# Patient Record
Sex: Male | Born: 1955 | Race: Black or African American | Hispanic: No | Marital: Married | Smoking: Never smoker
Health system: Southern US, Community
[De-identification: ages and names within clinical notes are randomized; demographics above are authoritative.]

---

## 1999-08-06 ENCOUNTER — Ambulatory Visit (HOSPITAL_COMMUNITY): Admission: RE | Admit: 1999-08-06 | Discharge: 1999-08-06 | Payer: Self-pay

## 2008-01-12 ENCOUNTER — Emergency Department (HOSPITAL_COMMUNITY): Admission: EM | Admit: 2008-01-12 | Discharge: 2008-01-12 | Payer: Self-pay | Admitting: Family Medicine

## 2010-04-25 LAB — CBC
HCT: 45.2 % (ref 39.0–52.0)
Hemoglobin: 14.7 g/dL (ref 13.0–17.0)
MCHC: 32.5 g/dL (ref 30.0–36.0)
MCV: 83.9 fL (ref 78.0–100.0)
Platelets: 218 10*3/uL (ref 150–400)
RBC: 5.39 MIL/uL (ref 4.22–5.81)
RDW: 13.9 % (ref 11.5–15.5)
WBC: 6.3 10*3/uL (ref 4.0–10.5)

## 2010-04-25 LAB — LIPASE, BLOOD: Lipase: 27 U/L (ref 11–59)

## 2010-04-25 LAB — COMPREHENSIVE METABOLIC PANEL
ALT: 37 U/L (ref 0–53)
AST: 32 U/L (ref 0–37)
Albumin: 4.1 g/dL (ref 3.5–5.2)
Alkaline Phosphatase: 49 U/L (ref 39–117)
BUN: 11 mg/dL (ref 6–23)
CO2: 26 mEq/L (ref 19–32)
Calcium: 9.1 mg/dL (ref 8.4–10.5)
Chloride: 101 mEq/L (ref 96–112)
Creatinine, Ser: 1.13 mg/dL (ref 0.4–1.5)
GFR calc Af Amer: >60 mL/min (ref >60)
GFR calc non Af Amer: >60 mL/min (ref >60)
Glucose, Bld: 117 mg/dL — ABNORMAL HIGH (ref 70–99)
Potassium: 3.7 mEq/L (ref 3.5–5.1)
Sodium: 136 mEq/L (ref 135–145)
Total Bilirubin: 0.5 mg/dL (ref 0.3–1.2)
Total Protein: 7.6 g/dL (ref 6.0–8.3)

## 2010-04-25 LAB — DIFFERENTIAL
Basophils Absolute: 0 10*3/uL (ref 0.0–0.1)
Basophils Relative: 0 % (ref 0–1)
Eosinophils Absolute: 0.2 10*3/uL (ref 0.0–0.7)
Eosinophils Relative: 3 % (ref 0–5)
Lymphocytes Relative: 40 % (ref 12–46)
Lymphs Abs: 2.5 10*3/uL (ref 0.7–4.0)
Monocytes Absolute: 0.5 10*3/uL (ref 0.1–1.0)
Monocytes Relative: 8 % (ref 3–12)
Neutro Abs: 3.1 10*3/uL (ref 1.7–7.7)
Neutrophils Relative %: 49 % (ref 43–77)

## 2013-07-15 ENCOUNTER — Other Ambulatory Visit: Payer: Self-pay | Admitting: Internal Medicine

## 2013-07-15 ENCOUNTER — Ambulatory Visit
Admission: RE | Admit: 2013-07-15 | Discharge: 2013-07-15 | Disposition: A | Discharge status: Home / Self Care | Payer: BC Managed Care – PPO | Source: Ambulatory Visit | Attending: Internal Medicine | Admitting: Internal Medicine

## 2013-07-15 DIAGNOSIS — R05 Cough: Secondary | ICD-10-CM

## 2013-07-15 DIAGNOSIS — R059 Cough, unspecified: Secondary | ICD-10-CM

## 2015-02-26 IMAGING — CR DG CHEST 2V
2 series · 2 of 2 positions shown · non-contrast
Comparison: None.

CLINICAL DATA: Cough.

EXAM:
CHEST  2 VIEW

[w chest pa]
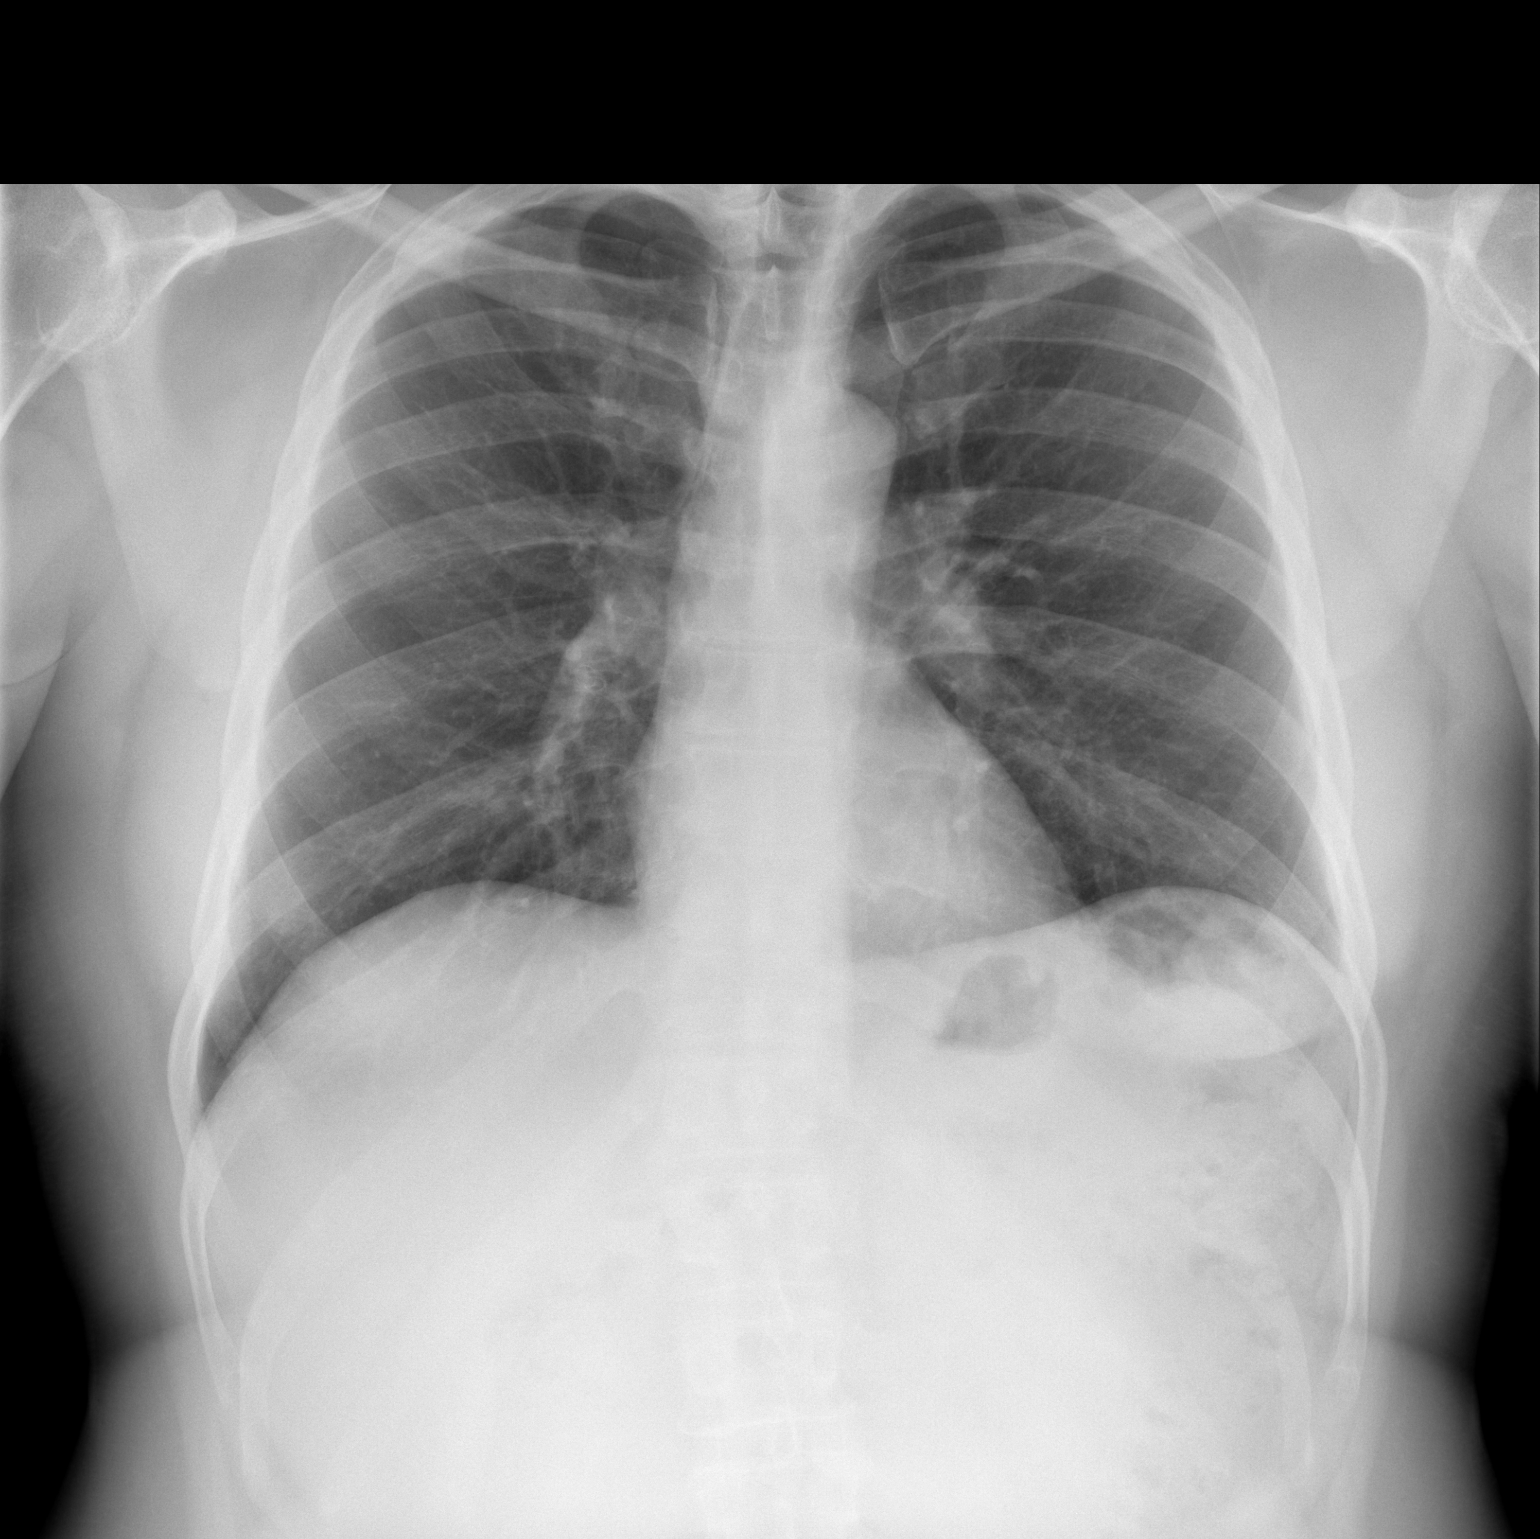

[w chest lat]
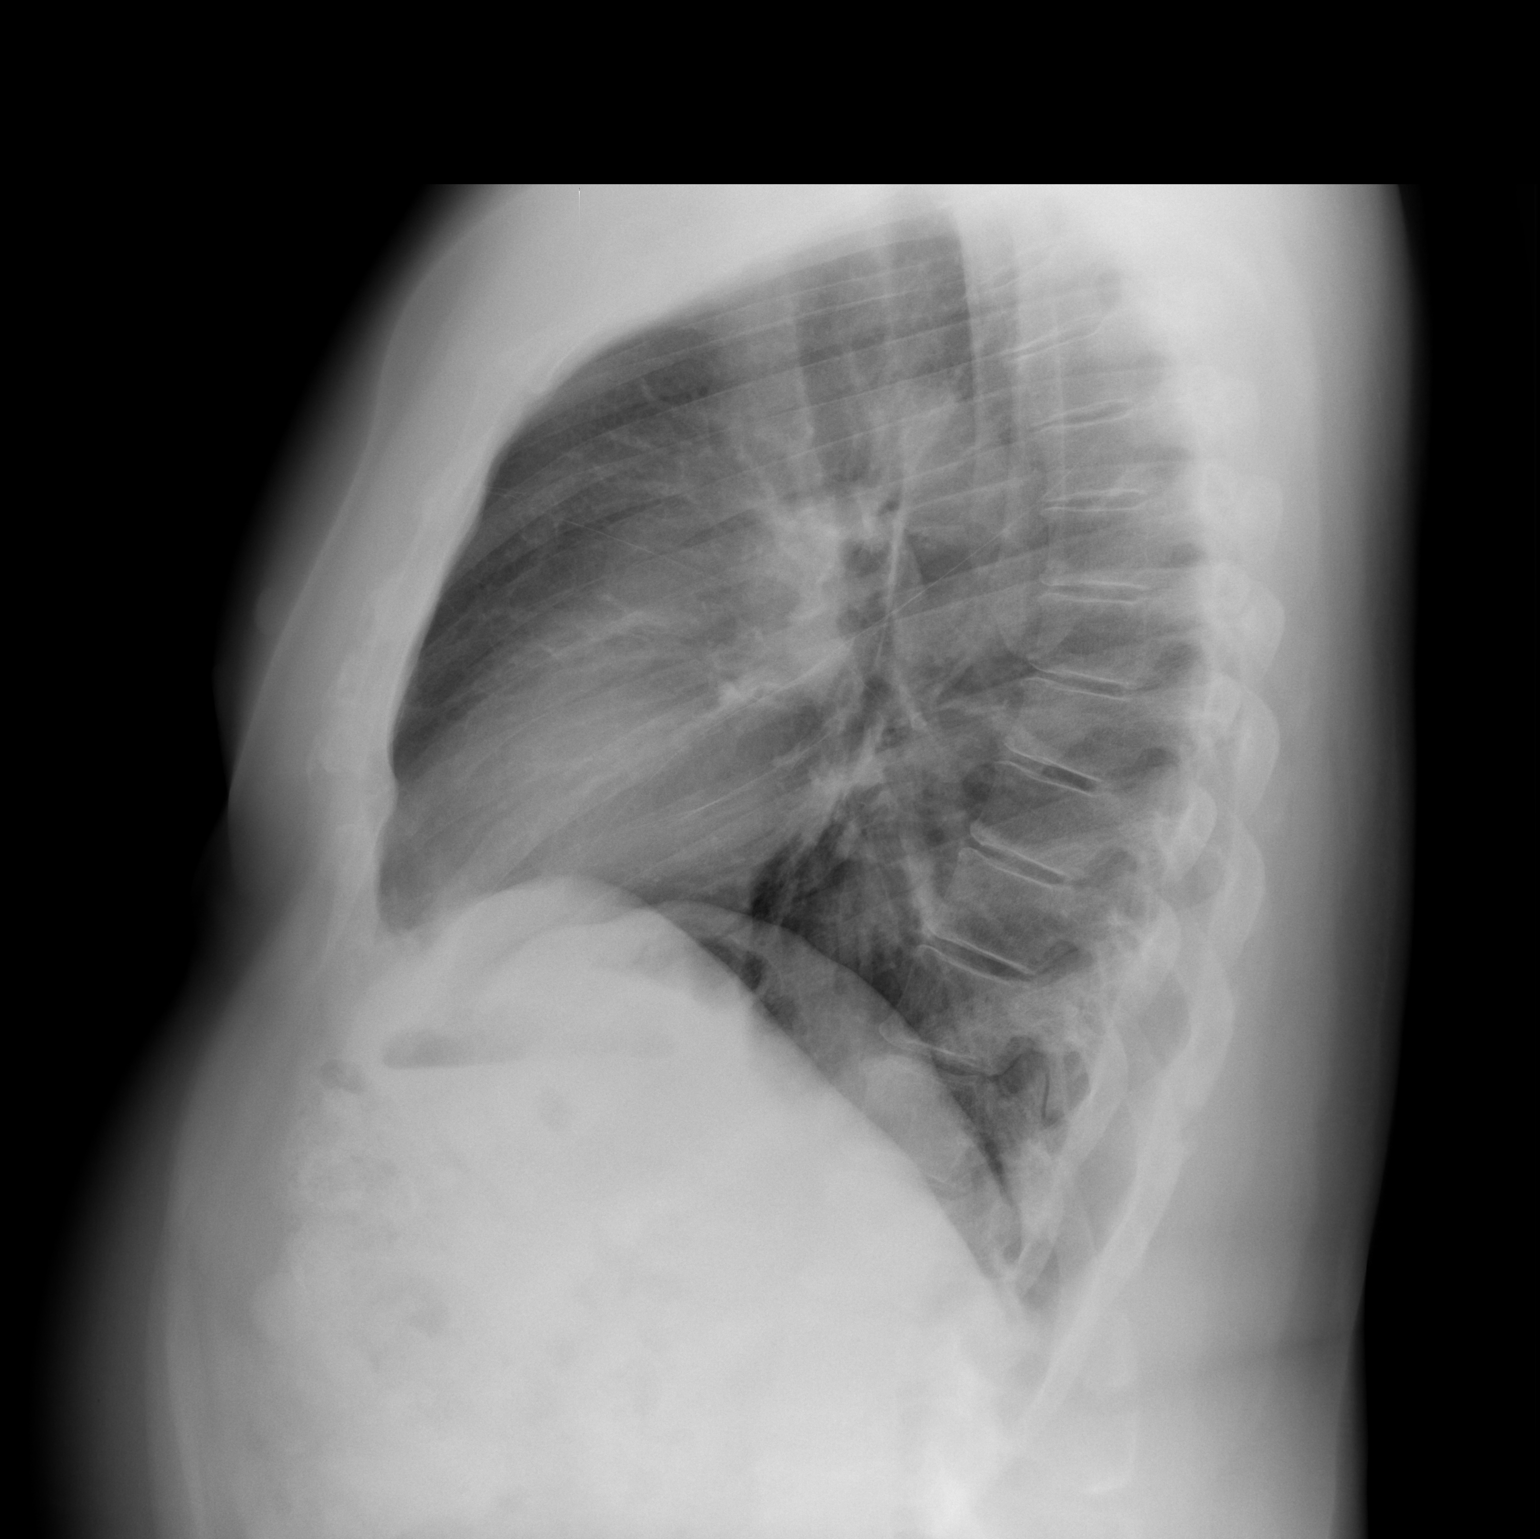

[2 of 2 positions shown; findings below may reference images not displayed]

FINDINGS: Mediastinum and hilar structures normal. Lungs are clear. Cardiac
structures are unremarkable. No acute bony abnormality .
IMPRESSION: No active cardiopulmonary disease.

## 2018-07-31 ENCOUNTER — Other Ambulatory Visit: Payer: Self-pay

## 2018-07-31 DIAGNOSIS — Z20822 Contact with and (suspected) exposure to covid-19: Secondary | ICD-10-CM

## 2018-08-03 LAB — NOVEL CORONAVIRUS, NAA: SARS-CoV-2, NAA: NOT DETECTED

## 2019-11-20 ENCOUNTER — Ambulatory Visit: Payer: Self-pay | Attending: Internal Medicine

## 2019-11-20 DIAGNOSIS — Z23 Encounter for immunization: Secondary | ICD-10-CM

## 2019-11-20 NOTE — Progress Notes (Signed)
   Covid-19 Vaccination Clinic  Name:  Alexander Hawkins    MRN: [id number] DOB: 06-28-1955  11/20/2019  Alexander Hawkins was observed post Covid-19 immunization for 15 minutes without incident. He was provided with Vaccine Information Sheet and instruction to access the V-Safe system.   Alexander Hawkins was instructed to call 911 with any severe reactions post vaccine: Marland Kitchen Difficulty breathing  . Swelling of face and throat  . A fast heartbeat  . A bad rash all over body  . Dizziness and weakness

## 2019-12-02 ENCOUNTER — Other Ambulatory Visit: Payer: Self-pay | Admitting: Gastroenterology

## 2019-12-18 ENCOUNTER — Ambulatory Visit (HOSPITAL_COMMUNITY): Admit: 2019-12-18 | Payer: Self-pay | Admitting: Gastroenterology

## 2019-12-18 ENCOUNTER — Encounter (HOSPITAL_COMMUNITY): Payer: Self-pay

## 2019-12-18 SURGERY — COLONOSCOPY WITH PROPOFOL
Anesthesia: Monitor Anesthesia Care

## 2020-02-21 ENCOUNTER — Ambulatory Visit (HOSPITAL_COMMUNITY)
Admission: EM | Admit: 2020-02-21 | Discharge: 2020-02-21 | Disposition: A | Discharge status: Home / Self Care | Payer: BC Managed Care – PPO | Attending: Family Medicine | Admitting: Family Medicine

## 2020-02-21 ENCOUNTER — Other Ambulatory Visit: Payer: Self-pay

## 2020-02-21 ENCOUNTER — Encounter (HOSPITAL_COMMUNITY): Payer: Self-pay | Admitting: *Deleted

## 2020-02-21 DIAGNOSIS — M545 Low back pain, unspecified: Secondary | ICD-10-CM | POA: Diagnosis not present

## 2020-02-21 MED ORDER — CYCLOBENZAPRINE HCL 10 MG PO TABS: ORAL_TABLET | ORAL | 0 refills | Status: AC

## 2020-02-21 MED ORDER — DICLOFENAC SODIUM 75 MG PO TBEC: 75.0000 mg | DELAYED_RELEASE_TABLET | Freq: Two times a day (BID) | ORAL | 0 refills | Status: AC

## 2020-02-21 NOTE — Discharge Instructions (Signed)

## 2020-02-21 NOTE — ED Provider Notes (Signed)
[facility]   [id number] 02/21/20 Arrival Time: 1007  ASSESSMENT & PLAN:  1. Acute right-sided low back pain without sciatica     Able to ambulate here and hemodynamically stable. No indication for imaging of back at this time given no trauma and normal neurological exam. Discussed.  Begin: Meds ordered this encounter  Medications  . diclofenac (VOLTAREN) 75 MG EC tablet    Sig: Take 1 tablet (75 mg total) by mouth 2 (two) times daily.    Dispense:  14 tablet    Refill:  0  . cyclobenzaprine (FLEXERIL) 10 MG tablet    Sig: Take 1 tablet by mouth 3 times daily as needed for muscle spasm. Warning: May cause drowsiness.    Dispense:  21 tablet    Refill:  0    Medication sedation precautions given. Encourage ROM/movement as tolerated.  Recommend:  Follow-up Information    Andi Devon, MD.   Specialty: Internal Medicine Why: If worsening or failing to improve as anticipated. Contact information: [address] [place] [address] [phone]               Reviewed expectations re: course of current medical issues. Questions answered. Outlined signs and symptoms indicating need for more acute intervention. Patient verbalized understanding. After Visit Summary given.   SUBJECTIVE: History from: patient.  Alexander Hawkins is a 66 y.o. male who presents with complaint of fairly persistent right sided lower back discomfort. Onset gradual. First noted approx 3 d ago. Injury/trama: no. History of back problems requiring medical care: none reported. Pain described as dull/aching and with radiation R post leg occasionally; "pulling feeling". Aggravating factors: prolonged walking/standing. Alleviating factors: sitting. Progressive LE weakness or saddle anesthesia: none. Extremity sensation changes or weakness: none. Ambulatory without difficulty. Normal bowel/bladder habits: yes; without urinary retention. Normal PO intake without n/v. No  associated abdominal pain/n/v. Self treatment: has acetaminophen, with no relief.  Reports no chronic steroid use, fevers, IV drug use, or recent back surgeries or procedures.    OBJECTIVE:  Vitals:   02/21/20 1031  BP: 127/82  Pulse: 65  Resp: 16  Temp: 97.8 F (36.6 C)  TempSrc: Oral  SpO2: 100%    General appearance: alert; no distress HEENT: [place]; AT Neck: supple with FROM; without midline tenderness CV: regular Lungs: unlabored respirations; speaks full sentences without difficulty Abdomen: soft, non-tender; non-distended Back: moderate and poorly localized tenderness to palpation over R lumbar musculature extending into upper buttock; FROM at waist; bruising: none; without midline tenderness; SLR negative Extremities: without edema; symmetrical without gross deformities; normal ROM of bilateral LE Skin: warm and dry Neurologic: normal gait; normal sensation and strength of bilateral LE Psychological: alert and cooperative; normal mood and affect   No Known Allergies  History reviewed. No pertinent past medical history.   Social History   Socioeconomic History  . Marital status: Married    Spouse name: Not on file  . Number of children: Not on file  . Years of education: Not on file  . Highest education level: Not on file  Occupational History  . Not on file  Tobacco Use  . Smoking status: Never Smoker  . Smokeless tobacco: Never Used  Substance and Sexual Activity  . Alcohol use: Not on file  . Drug use: Not on file  . Sexual activity: Not on file  Other Topics Concern  . Not on file  Social History Narrative  . Not on file   Social Determinants of  Health   Financial Resource Strain: Not on file  Food Insecurity: Not on file  Transportation Needs: Not on file  Physical Activity: Not on file  Stress: Not on file  Social Connections: Not on file  Intimate Partner Violence: Not on file   History reviewed. No pertinent family history. History  reviewed. No pertinent surgical history.   Mardella Layman, MD 02/21/20 1059

## 2020-02-21 NOTE — ED Triage Notes (Signed)
Pt reports 3 days of RT lower back Pain with out injury.

## 2020-07-11 ENCOUNTER — Other Ambulatory Visit: Payer: Self-pay

## 2020-07-11 ENCOUNTER — Ambulatory Visit (HOSPITAL_COMMUNITY)
Admission: EM | Admit: 2020-07-11 | Discharge: 2020-07-11 | Disposition: A | Discharge status: Home / Self Care | Payer: BC Managed Care – PPO | Attending: Family Medicine | Admitting: Family Medicine

## 2020-07-11 ENCOUNTER — Encounter (HOSPITAL_COMMUNITY): Payer: Self-pay | Admitting: *Deleted

## 2020-07-11 DIAGNOSIS — U071 COVID-19: Secondary | ICD-10-CM

## 2020-07-11 DIAGNOSIS — R519 Headache, unspecified: Secondary | ICD-10-CM | POA: Insufficient documentation

## 2020-07-11 DIAGNOSIS — R059 Cough, unspecified: Secondary | ICD-10-CM | POA: Diagnosis present

## 2020-07-11 MED ORDER — PROMETHAZINE-DM 6.25-15 MG/5ML PO SYRP: 5.0000 mL | ORAL_SOLUTION | Freq: Four times a day (QID) | ORAL | 0 refills | Status: AC | PRN

## 2020-07-11 MED ORDER — ALBUTEROL SULFATE HFA 108 (90 BASE) MCG/ACT IN AERS
1.0000 | INHALATION_SPRAY | Freq: Four times a day (QID) | RESPIRATORY_TRACT | 0 refills | Status: AC | PRN
Start: 2020-07-11 — End: ?

## 2020-07-11 NOTE — ED Provider Notes (Signed)
[facility]    CSN: [id number] Arrival date & time: 07/11/20  1116      History   Chief Complaint Chief Complaint  Patient presents with   Cough   Headache    HPI Alexander DRUMMER is a 65 y.o. male.   Patient presenting today with 1 day history of significant cough, headache.  Denies fever, chills, chest pain, shortness of breath, abdominal pain, nausea vomiting or diarrhea.  So far has not been taking anything over-the-counter for symptoms.  Home COVID test was positive this morning.  He is requesting to be retested and have symptomatic treatment.  Denies pertinent past medical history.   History reviewed. No pertinent past medical history.  There are no problems to display for this patient.   History reviewed. No pertinent surgical history.     Home Medications    Prior to Admission medications   Medication Sig Start Date End Date Taking? Authorizing Provider  albuterol (VENTOLIN HFA) 108 (90 Base) MCG/ACT inhaler Inhale 1-2 puffs into the lungs every 6 (six) hours as needed for wheezing or shortness of breath. 07/11/20  Yes Particia Nearing, PA-C  promethazine-dextromethorphan (PROMETHAZINE-DM) 6.25-15 MG/5ML syrup Take 5 mLs by mouth 4 (four) times daily as needed for cough. 07/11/20  Yes Particia Nearing, PA-C  cyclobenzaprine (FLEXERIL) 10 MG tablet Take 1 tablet by mouth 3 times daily as needed for muscle spasm. Warning: May cause drowsiness. 02/21/20   Mardella Layman, MD  diclofenac (VOLTAREN) 75 MG EC tablet Take 1 tablet (75 mg total) by mouth 2 (two) times daily. 02/21/20   Mardella Layman, MD    Family History History reviewed. No pertinent family history.  Social History Social History   Tobacco Use   Smoking status: Never   Smokeless tobacco: Never     Allergies   Patient has no known allergies.   Review of Systems Review of Systems Per HPI  Physical Exam Triage Vital Signs ED Triage Vitals  Enc Vitals Group     BP 07/11/20  1229 128/86     Pulse Rate 07/11/20 1229 81     Resp 07/11/20 1229 18     Temp 07/11/20 1229 98.7 F (37.1 C)     Temp src --      SpO2 07/11/20 1229 99 %     Weight --      Height --      Head Circumference --      Peak Flow --      Pain Score 07/11/20 1226 0     Pain Loc --      Pain Edu? --      Excl. in GC? --    No data found.  Updated Vital Signs BP 128/86   Pulse 81   Temp 98.7 F (37.1 C)   Resp 18   SpO2 99%   Visual Acuity Right Eye Distance:   Left Eye Distance:   Bilateral Distance:    Right Eye Near:   Left Eye Near:    Bilateral Near:     Physical Exam Vitals and nursing note reviewed.  Constitutional:      Appearance: Normal appearance.  HENT:     Head: Atraumatic.     Right Ear: Tympanic membrane normal.     Left Ear: Tympanic membrane normal.     Nose: Rhinorrhea present.     Mouth/Throat:     Mouth: Mucous membranes are moist.     Pharynx: Posterior oropharyngeal erythema present.  No oropharyngeal exudate.  Eyes:     Extraocular Movements: Extraocular movements intact.     Conjunctiva/sclera: Conjunctivae normal.  Cardiovascular:     Rate and Rhythm: Normal rate and regular rhythm.  Pulmonary:     Effort: Pulmonary effort is normal. No respiratory distress.     Breath sounds: Normal breath sounds. No wheezing or rales.  Abdominal:     General: Bowel sounds are normal. There is no distension.     Palpations: Abdomen is soft.     Tenderness: There is no abdominal tenderness. There is no right CVA tenderness, left CVA tenderness or guarding.  Musculoskeletal:        General: Normal range of motion.     Cervical back: Normal range of motion and neck supple.  Skin:    General: Skin is warm and dry.  Neurological:     General: No focal deficit present.     Mental Status: He is oriented to person, place, and time.  Psychiatric:        Mood and Affect: Mood normal.        Thought Content: Thought content normal.        Judgment: Judgment  normal.     UC Treatments / Results  Labs (all labs ordered are listed, but only abnormal results are displayed) Labs Reviewed  SARS CORONAVIRUS 2 (TAT 6-24 HRS)    EKG   Radiology No results found.  Procedures Procedures (including critical care time)  Medications Ordered in UC Medications - No data to display  Initial Impression / Assessment and Plan / UC Course  I have reviewed the triage vital signs and the nursing notes.  Pertinent labs & imaging results that were available during my care of the patient were reviewed by me and considered in my medical decision making (see chart for details).     Home test positive, discussed with patient that it was unnecessary to perform PCR test today but he is requesting one anyway just to be certain.  COVID PCR pending for reassurance.  Vitals and exam very reassuring today.  Discussed albuterol, Phenergan DM for symptomatic management over-the-counter and supportive home care additionally.  Return for acutely worsening symptoms.  Work note given with Barrister's clerk.  Final Clinical Impressions(s) / UC Diagnoses   Final diagnoses:  COVID-19  Cough  Bad headache   Discharge Instructions   None    ED Prescriptions     Medication Sig Dispense Auth. Provider   albuterol (VENTOLIN HFA) 108 (90 Base) MCG/ACT inhaler Inhale 1-2 puffs into the lungs every 6 (six) hours as needed for wheezing or shortness of breath. 18 g Roosvelt Maser [place], [place]   promethazine-dextromethorphan (PROMETHAZINE-DM) 6.25-15 MG/5ML syrup Take 5 mLs by mouth 4 (four) times daily as needed for cough. 100 mL Particia Nearing, [place]      PDMP not reviewed this encounter.   Particia Nearing, [place] 07/11/20 1326

## 2020-07-11 NOTE — ED Triage Notes (Signed)
Pt reports HA and cought hat started yesterday. Pt tested neg with a at home test.

## 2020-07-12 LAB — SARS CORONAVIRUS 2 (TAT 6-24 HRS): SARS Coronavirus 2: POSITIVE — AB

## 2020-07-13 ENCOUNTER — Telehealth (HOSPITAL_COMMUNITY): Payer: Self-pay | Admitting: Emergency Medicine

## 2020-07-13 MED ORDER — MOLNUPIRAVIR EUA 200MG CAPSULE: 4.0000 | ORAL_CAPSULE | Freq: Two times a day (BID) | ORAL | 0 refills | Status: AC

## 2023-01-17 ENCOUNTER — Ambulatory Visit: Payer: Self-pay | Admitting: Family Medicine

## 2023-01-30 ENCOUNTER — Other Ambulatory Visit (HOSPITAL_BASED_OUTPATIENT_CLINIC_OR_DEPARTMENT_OTHER): Payer: Self-pay | Admitting: Internal Medicine

## 2023-01-30 DIAGNOSIS — E782 Mixed hyperlipidemia: Secondary | ICD-10-CM

## 2023-02-12 ENCOUNTER — Other Ambulatory Visit (HOSPITAL_BASED_OUTPATIENT_CLINIC_OR_DEPARTMENT_OTHER): Payer: Self-pay

## 2023-02-12 MED ORDER — FLUAD 0.5 ML IM SUSY
PREFILLED_SYRINGE | INTRAMUSCULAR | 0 refills | Status: AC
  Filled 2023-02-12: qty 0.5, 1d supply, fill #0

## 2023-02-22 ENCOUNTER — Ambulatory Visit (HOSPITAL_COMMUNITY)
Admission: RE | Admit: 2023-02-22 | Discharge: 2023-02-22 | Disposition: A | Discharge status: Home / Self Care | Payer: Self-pay | Source: Ambulatory Visit | Attending: Internal Medicine | Admitting: Internal Medicine

## 2023-02-22 DIAGNOSIS — E782 Mixed hyperlipidemia: Secondary | ICD-10-CM | POA: Insufficient documentation

## 2023-03-08 ENCOUNTER — Other Ambulatory Visit (HOSPITAL_BASED_OUTPATIENT_CLINIC_OR_DEPARTMENT_OTHER): Payer: Self-pay

## 2023-04-30 ENCOUNTER — Other Ambulatory Visit (HOSPITAL_COMMUNITY): Payer: Self-pay

## 2023-07-06 ENCOUNTER — Other Ambulatory Visit (HOSPITAL_BASED_OUTPATIENT_CLINIC_OR_DEPARTMENT_OTHER): Payer: Self-pay

## 2023-07-06 MED ORDER — CAPVAXIVE 0.5 ML IM SOSY
0.5000 mL | PREFILLED_SYRINGE | Freq: Once | INTRAMUSCULAR | 0 refills | Status: AC
  Filled 2023-07-06: qty 0.5, 1d supply, fill #0

## 2023-12-04 ENCOUNTER — Other Ambulatory Visit (HOSPITAL_COMMUNITY): Payer: Self-pay
